# Patient Record
Sex: Female | Born: 1935 | Race: White | Hispanic: No | Smoking: Never smoker
Health system: Southern US, Community
[De-identification: ages and names within clinical notes are randomized; demographics above are authoritative.]

## PROBLEM LIST (undated history)

## (undated) DIAGNOSIS — C439 Malignant melanoma of skin, unspecified: Secondary | ICD-10-CM

## (undated) DIAGNOSIS — I10 Essential (primary) hypertension: Secondary | ICD-10-CM

---

## 1998-11-05 ENCOUNTER — Other Ambulatory Visit: Admission: RE | Admit: 1998-11-05 | Discharge: 1998-11-05 | Payer: Self-pay | Admitting: Obstetrics & Gynecology

## 1999-12-20 ENCOUNTER — Other Ambulatory Visit: Admission: RE | Admit: 1999-12-20 | Discharge: 1999-12-20 | Payer: Self-pay | Admitting: Obstetrics & Gynecology

## 2003-04-19 ENCOUNTER — Other Ambulatory Visit: Admission: RE | Admit: 2003-04-19 | Discharge: 2003-04-19 | Payer: Self-pay | Admitting: Obstetrics & Gynecology

## 2004-06-16 ENCOUNTER — Ambulatory Visit: Payer: Self-pay | Admitting: Internal Medicine

## 2005-05-09 ENCOUNTER — Other Ambulatory Visit: Admission: RE | Admit: 2005-05-09 | Discharge: 2005-05-09 | Payer: Self-pay | Admitting: Obstetrics & Gynecology

## 2006-10-24 ENCOUNTER — Ambulatory Visit: Payer: Self-pay | Admitting: Internal Medicine

## 2008-07-16 ENCOUNTER — Ambulatory Visit: Payer: Self-pay | Admitting: Urology

## 2010-03-15 ENCOUNTER — Ambulatory Visit: Payer: Self-pay | Admitting: Internal Medicine

## 2010-06-13 ENCOUNTER — Emergency Department: Payer: Self-pay | Admitting: Emergency Medicine

## 2010-11-12 ENCOUNTER — Ambulatory Visit: Payer: Self-pay | Admitting: Internal Medicine

## 2012-06-12 ENCOUNTER — Ambulatory Visit: Payer: Self-pay | Admitting: Internal Medicine

## 2012-08-29 ENCOUNTER — Ambulatory Visit: Payer: Self-pay | Admitting: Internal Medicine

## 2013-06-13 ENCOUNTER — Ambulatory Visit: Payer: Self-pay | Admitting: Family

## 2014-06-24 ENCOUNTER — Ambulatory Visit: Payer: Self-pay | Admitting: Internal Medicine

## 2017-05-28 ENCOUNTER — Encounter: Payer: Self-pay | Admitting: *Deleted

## 2017-05-28 ENCOUNTER — Emergency Department: Payer: Medicare Other

## 2017-05-28 ENCOUNTER — Emergency Department
Admission: EM | Admit: 2017-05-28 | Discharge: 2017-05-28 | Disposition: A | Discharge status: Home / Self Care | Payer: Medicare Other | Attending: Emergency Medicine | Admitting: Emergency Medicine

## 2017-05-28 DIAGNOSIS — Y9389 Activity, other specified: Secondary | ICD-10-CM | POA: Diagnosis not present

## 2017-05-28 DIAGNOSIS — Y9289 Other specified places as the place of occurrence of the external cause: Secondary | ICD-10-CM | POA: Diagnosis not present

## 2017-05-28 DIAGNOSIS — S0003XA Contusion of scalp, initial encounter: Secondary | ICD-10-CM | POA: Diagnosis not present

## 2017-05-28 DIAGNOSIS — S52501A Unspecified fracture of the lower end of right radius, initial encounter for closed fracture: Secondary | ICD-10-CM

## 2017-05-28 DIAGNOSIS — Y999 Unspecified external cause status: Secondary | ICD-10-CM | POA: Diagnosis not present

## 2017-05-28 DIAGNOSIS — W108XXA Fall (on) (from) other stairs and steps, initial encounter: Secondary | ICD-10-CM | POA: Insufficient documentation

## 2017-05-28 DIAGNOSIS — W19XXXA Unspecified fall, initial encounter: Secondary | ICD-10-CM

## 2017-05-28 DIAGNOSIS — S6991XA Unspecified injury of right wrist, hand and finger(s), initial encounter: Secondary | ICD-10-CM | POA: Diagnosis present

## 2017-05-28 NOTE — Discharge Instructions (Signed)
Follow-up with Dr. Rudene Christians, offered an appointment. Please keep your arm elevated as much as possible and apply ice at least 3 times a day. If your fingers become swollen and the ring feels tight please return to emergency department for ring removal. Take Tylenol as needed for pain. If you develop a headache please return to the emergency department

## 2017-05-28 NOTE — ED Notes (Signed)
Pt reports that she tripped on a step and caught herself with left arm/wrist yesterday - the area started swelling last night and the pain increased so pt came to ER for eval - pt c/o severe pain with wrist movement - pt is able to move fingers with cap refill less than 3 seconds

## 2017-05-28 NOTE — ED Provider Notes (Signed)
[facility] Emergency Department Provider Note  ____________________________________________   First MD Initiated Contact with Patient 05/28/17 1431     (approximate)  I have reviewed the triage vital signs and the nursing notes.   HISTORY  Chief Complaint Wrist Pain    HPI Nancy Warren is a 83 y.o. female 8 she was climbing the steps to go into a trailer yesterday and fell backwards landing on her left side. She landed on her wrist, left upper arm, and shoulder. She also hit her head and has a bruise on the left side of her scalp.did not lose consciousness. Had a small headache yesterday but none today. Does take an aspirin a day. Denies any other injuries.   History reviewed. No pertinent past medical history.  There are no active problems to display for this patient.   History reviewed. No pertinent surgical history.  Prior to Admission medications   Not on File    Allergies Patient has no allergy information on record.  History reviewed. No pertinent family history.  Social History Social History  Substance Use Topics  . Smoking status: Never Smoker  . Smokeless tobacco: Never Used  . Alcohol use Yes     Comment: occasionally    Review of Systems  Constitutional: No fever/chills, no headache today Eyes: No visual changes. ENT: No sore throat. Respiratory: Denies cough Genitourinary: Negative for dysuria. Musculoskeletal: Negative for back pain. positive for left wrist pain Skin: Negative for rash. Positive for bruising    ____________________________________________   PHYSICAL EXAM:  VITAL SIGNS: ED Triage Vitals  Enc Vitals Group     BP 05/28/17 1300 (!) 200/83     Pulse Rate 05/28/17 1300 65     Resp 05/28/17 1300 16     Temp 05/28/17 1300 98.2 F (36.8 C)     Temp src --      SpO2 05/28/17 1300 96 %     Weight 05/28/17 1302 158 lb (71.7 kg)     Height 05/28/17 1302 5\' 3"  (1.6 m)     Head Circumference --       Peak Flow --      Pain Score 05/28/17 1259 7     Pain Loc --      Pain Edu? --      Excl. in [place]? --     Constitutional: Alert and oriented. Well appearing and in no acute distress. Eyes: Conjunctivae are normal. perrl eomi Head: tender on left side scalp, parietal area Nose: No congestion/rhinnorhea. Mouth/Throat: Mucous membranes are moist.   Cardiovascular: Normal rate, regular rhythm. Elevated blood pressure Respiratory: Normal respiratory effort.  No retractions GU: deferred Musculoskeletal: eft wrist is swollen and questionable deformity at the distal radius. Patient is able to move all fingers area elbow is nontender. Shoulder is a little tender.Neurovascular is intact Neurologic:  Normal speech and language.  Skin:  Skin is warm, dry and intact. No rash noted.Positive bruising at the left wrist and left side of scalp. Psychiatric: Mood and affect are normal. Speech and behavior are normal.  ____________________________________________   LABS (all labs ordered are listed, but only abnormal results are displayed)  Labs Reviewed - No data to display ____________________________________________   ____________________________________________  RADIOLOGY  Left wrist with fracture at distal radius and avulsion to the styloid. CT of the head ordered due to patient's age and aspirin use along with headache yesterday.cT was negative for acute bleed  ____________________________________________   PROCEDURES  Procedure(s) performed: plan  applied to the left wrist. Sling applied to the left arm.      ____________________________________________   INITIAL IMPRESSION / ASSESSMENT AND PLAN / ED COURSE  Pertinent labs & imaging results that were available during my care of the patient were reviewed by me and considered in my medical decision making (see chart for details).  Patient is a very "young" 83 year old female. She appears well and in no distress. She has a  fracture of the distal radius and an avulsion fracture of the styloid process. Patient will be placed in a short arm splint. CT of the head ordered. CT was negative. Patient was discharged in the care of her daughter.  They were given instructions to call orthopedics for an appointment. review of instructions to return to the emergency department if any sign of headache. Patient is to return to the emergency department if her fingers become swollen. Instructed to keep the arm elevated and apply ice. Patient to use Tylenol as needed for pain. Patient did not want any narcotic pain medicine.      ____________________________________________   FINAL CLINICAL IMPRESSION(S) / ED DIAGNOSES  Final diagnoses:  Closed fracture of distal end of right radius, unspecified fracture morphology, initial encounter  Fall, initial encounter  Contusion of scalp, initial encounter      NEW MEDICATIONS STARTED DURING THIS VISIT:  New Prescriptions   No medications on file     Note:  This document was prepared using Dragon voice recognition software and may include unintentional dictation errors.    Versie Starks, PA-C 05/28/17 1548    Lavonia Drafts, MD 05/29/17 [phone]

## 2017-05-28 NOTE — ED Triage Notes (Signed)
Pt reports having fallen on left wrist yesterday. Swelling and bruising noted to wrist. PT able to move fingers and sensation is intact at this time.

## 2017-09-15 ENCOUNTER — Other Ambulatory Visit: Payer: Self-pay | Admitting: Internal Medicine

## 2017-09-15 DIAGNOSIS — K625 Hemorrhage of anus and rectum: Secondary | ICD-10-CM

## 2017-09-15 DIAGNOSIS — R1031 Right lower quadrant pain: Secondary | ICD-10-CM

## 2017-09-15 DIAGNOSIS — R1032 Left lower quadrant pain: Principal | ICD-10-CM

## 2017-09-25 ENCOUNTER — Ambulatory Visit
Admission: RE | Admit: 2017-09-25 | Discharge: 2017-09-25 | Disposition: A | Discharge status: Home / Self Care | Payer: Medicare Other | Source: Ambulatory Visit | Attending: Internal Medicine | Admitting: Internal Medicine

## 2017-09-25 DIAGNOSIS — K5730 Diverticulosis of large intestine without perforation or abscess without bleeding: Secondary | ICD-10-CM | POA: Insufficient documentation

## 2017-09-25 DIAGNOSIS — R1032 Left lower quadrant pain: Secondary | ICD-10-CM | POA: Insufficient documentation

## 2017-09-25 DIAGNOSIS — K8020 Calculus of gallbladder without cholecystitis without obstruction: Secondary | ICD-10-CM | POA: Diagnosis not present

## 2017-09-25 DIAGNOSIS — I700 Atherosclerosis of aorta: Secondary | ICD-10-CM | POA: Diagnosis not present

## 2017-09-25 DIAGNOSIS — R918 Other nonspecific abnormal finding of lung field: Secondary | ICD-10-CM | POA: Insufficient documentation

## 2017-09-25 DIAGNOSIS — K625 Hemorrhage of anus and rectum: Secondary | ICD-10-CM | POA: Diagnosis present

## 2017-09-25 DIAGNOSIS — R1031 Right lower quadrant pain: Secondary | ICD-10-CM | POA: Insufficient documentation

## 2017-09-25 HISTORY — DX: Malignant melanoma of skin, unspecified: C43.9

## 2017-09-25 HISTORY — DX: Essential (primary) hypertension: I10

## 2017-09-25 MED ORDER — IOPAMIDOL (ISOVUE-300) INJECTION 61%
85.0000 mL | Freq: Once | INTRAVENOUS | Status: AC | PRN
  Administered 2017-09-25: 85 mL via INTRAVENOUS

## 2019-10-06 IMAGING — DX DG WRIST COMPLETE 3+V*L*
4 series · 4 of 4 positions shown · non-contrast
Comparison: None.

CLINICAL DATA: Fall yesterday with left wrist injury, pain and
swelling. Initial encounter.

EXAM:
LEFT WRIST - COMPLETE 3+ VIEW

[wrist ap (1 of 2)]
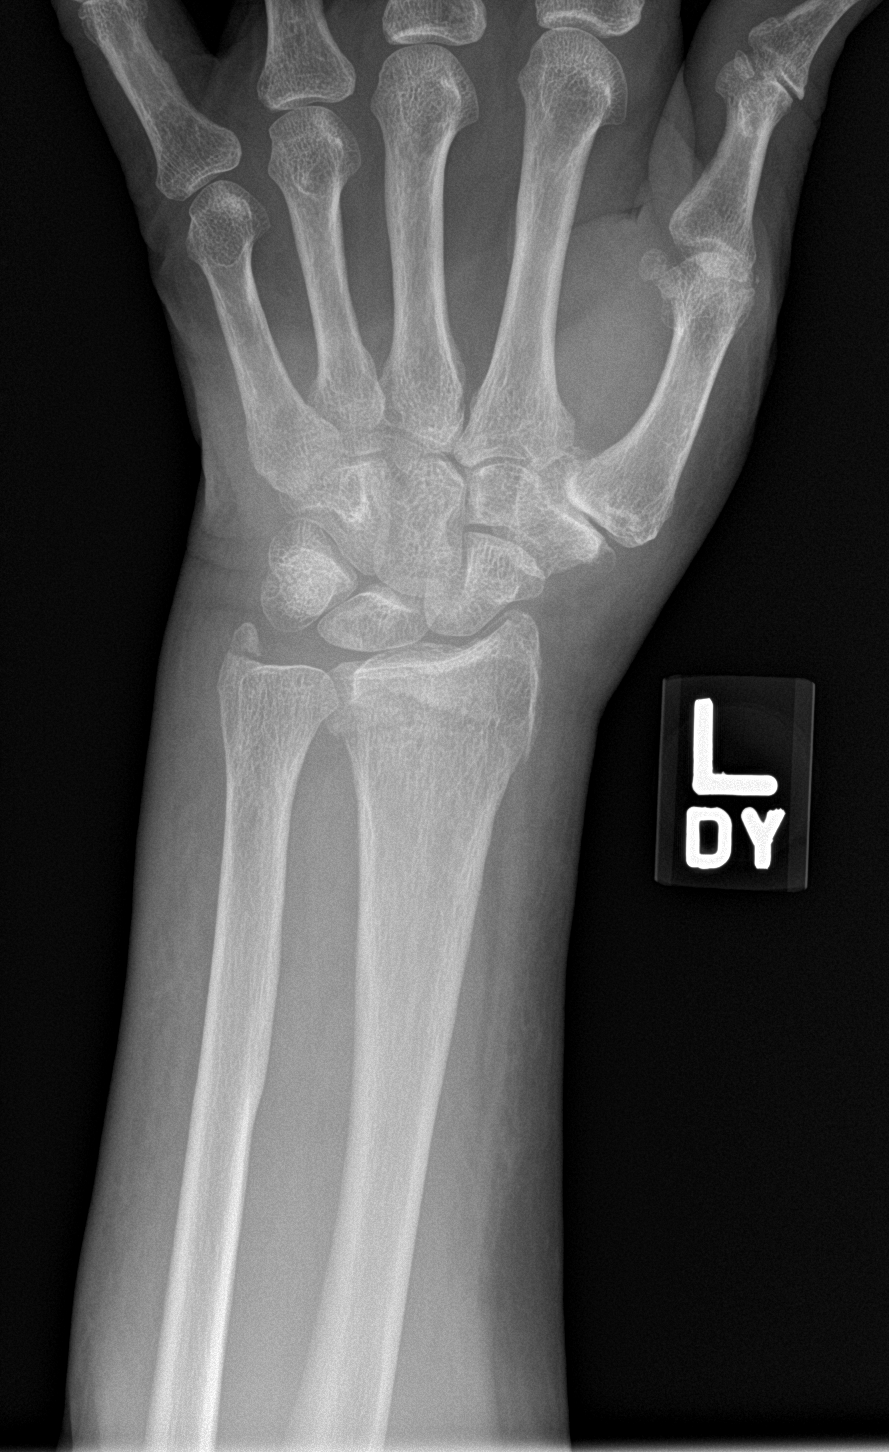

[wrist obl]
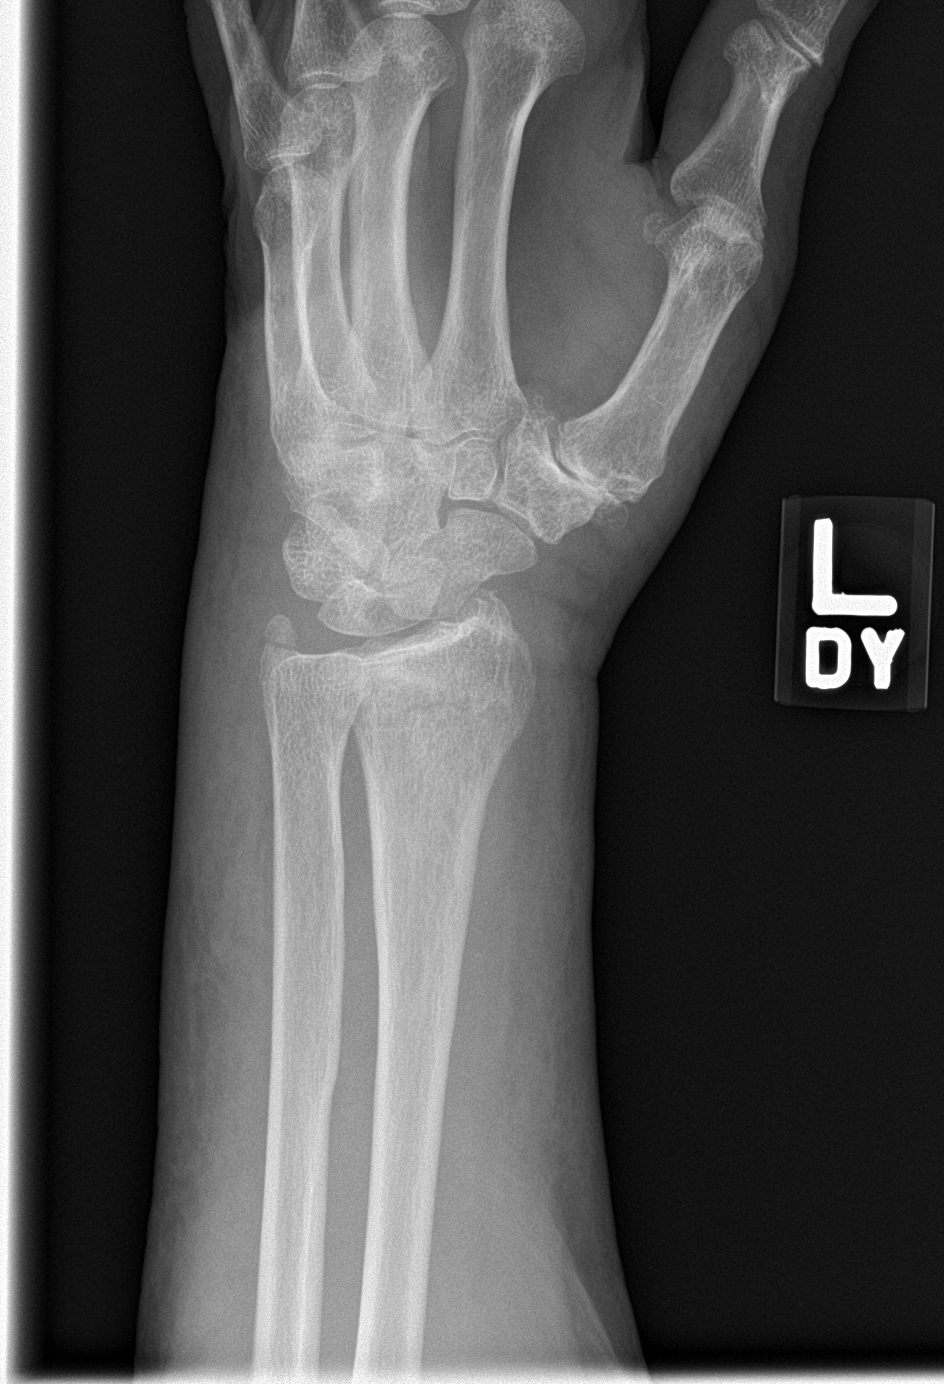

[wrist lat]
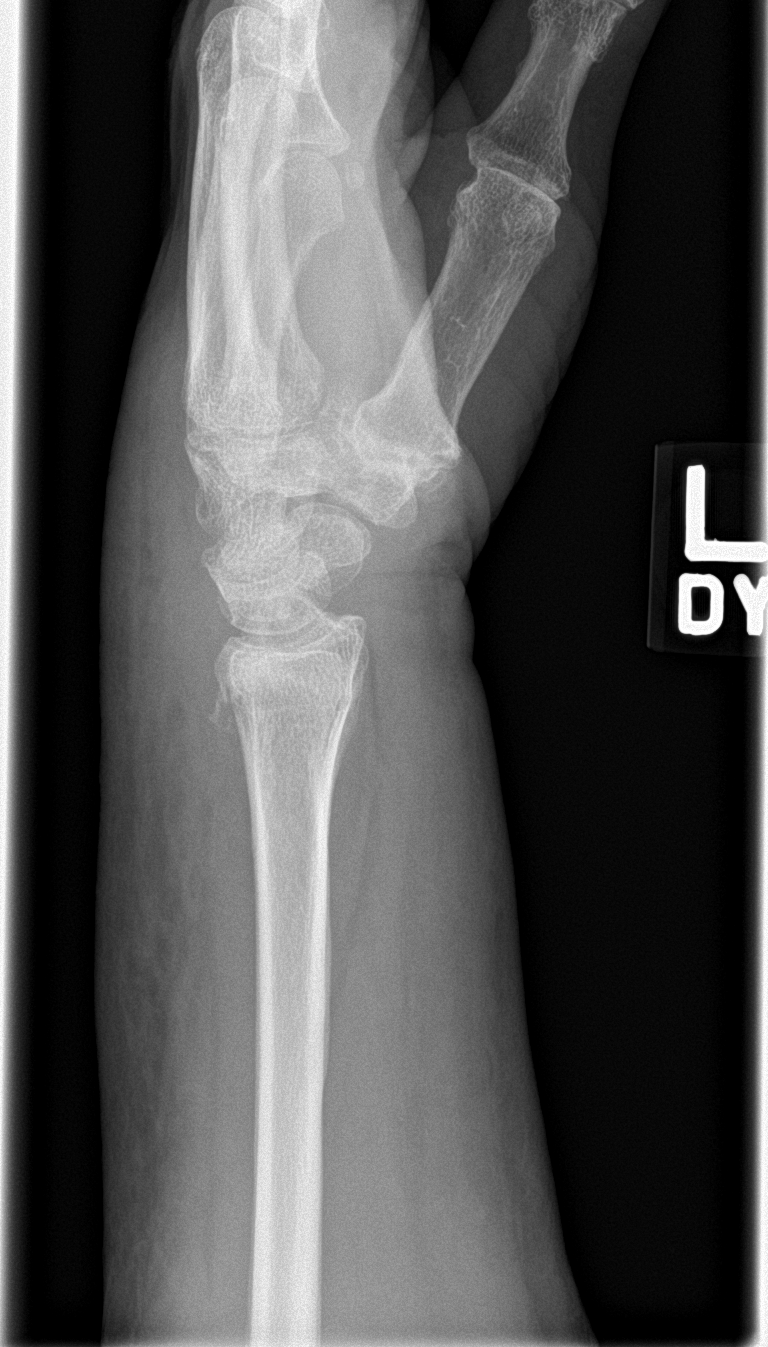

[wrist ap (2 of 2)]
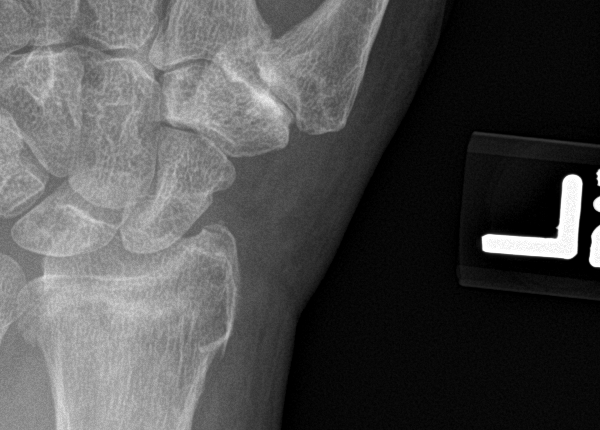

[4 of 4 positions shown; findings below may reference images not displayed]

FINDINGS: Acute impacted fracture transversely oriented through the distal
radial metaphysis shows no significant angulation. There are mildly
displaced cortical fragments dorsally. Acute ulnar styloid avulsion
also noted with minimal displacement. The carpal bones show normal
alignment without evidence of fracture or dislocation. Soft tissue
swelling present.
IMPRESSION: Acute an impacted fracture of the distal radial metaphysis showing
no significant angulation. Some displaced cortical fragments are
present dorsally. Associated ulnar styloid avulsion shows minimal
displacement.

## 2020-03-10 NOTE — Progress Notes (Signed)
03/11/2020 9:24 AM   Nancy Warren Sep 10, 1925 [id number]  Referring provider: Idelle Crouch, MD [facility] [place],  [place] [address] Chief Complaint  Patient presents with  . Recurrent UTI    HPI: Nancy Warren is a 86 y.o. female with UTI symptoms presents today for evaluation.   She is accompanied today by her daughter.  She reports that she has been having recurrent urinary tract infections since May.  She is had multiple rounds of antibiotics as outlined below and shortly after completing the antibiotics, her symptoms quickly returned.  She has had several treatment rounds with no UA/urinalysis.  CT A/P w/ contrast on 09/25/2017 showed no pathology.  She was seen at [facility] on 01/30/2020 for possible infection. UA was negative and she was still treated with antibiotics.   The patient was seen by Dr. Nicki Reaper on 03/06/2020 at [facility]. She had urgency to urinate but could not go. She had constant burning pain around the pelvic area (7/10). She had dysuria and hematuria off and on x 2 months. She reported being treated with Cipro, which proved resistant. Augmentin gave her diarrhea. She was currently on Macrobid. No flank pain, fever, nausea or vomiting.   Patient was thought to have a chronic or recurrent infection and was treated with Omnicef 300 mg BID x 10 days which she remains on. UA on 03/06/2020 showed protein 100, glucose 250, gross hematuria, nitrite positive, large leukocytes, WBC >50, RBC >50, moderate bacteria and rare squamous epithelial cells. Associated culture grew mixed flora less than 10,000 colonies/mL.  (+) Urine culture: 02/10/2020: E .Coli 07/02/2018: E. Coli  PVR is 7 mL today.   Patient is currently on antibiotic treatment for recently diagnosed UTI. She denies any symptoms today.  Reports constant vaginal irritation and burning externally. She does not wipe her vagina but instead pats the area dry. Denies  history of cancer. She is not using any estrogen cream.   Reports occasional constipation.    PMH: Past Medical History:  Diagnosis Date  . Hypertension   . Melanoma of skin ([place])    Pt states was resected from her back 40 years ago.     Surgical History: No past surgical history on file.  Home Medications:  Allergies as of 03/11/2020   No Known Allergies     Medication List       Accurate as of March 11, 2020 11:59 PM. If you have any questions, ask your nurse or doctor.        STOP taking these medications   Cranberry 1000 MG Caps Stopped by: Hollice Espy, MD   ELDERBERRY PO Stopped by: Hollice Espy, MD   lansoprazole 15 MG capsule Commonly known as: PREVACID Stopped by: Hollice Espy, MD     TAKE these medications   ASPIRIN 81 PO Aspir-81   cefdinir 300 MG capsule Commonly known as: OMNICEF Take 300 mg by mouth 2 (two) times daily.   cyanocobalamin 1000 MCG tablet Take by mouth.   estradiol 0.1 MG/GM vaginal cream Commonly known as: ESTRACE Apply pea size amount to urethral area mon, wed, Friday. Started by: Hollice Espy, MD   lisinopril 10 MG tablet Commonly known as: ZESTRIL Take 10 mg by mouth 2 (two) times daily.   metoprolol succinate 100 MG 24 hr tablet Commonly known as: TOPROL-XL Take by mouth.   omeprazole 40 MG capsule Commonly known as: PRILOSEC Take 40 mg by mouth daily.   simvastatin 20  MG tablet Commonly known as: ZOCOR Take 20 mg by mouth daily.       Allergies: No Known Allergies  Family History: No family history on file.  Social History:  reports that she has never smoked. She has never used smokeless tobacco. She reports current alcohol use. She reports that she does not use drugs.   Physical Exam: BP (!) 186/85   Pulse 60   Ht 5\' 2"  (1.575 m)   Wt 152 lb (68.9 kg)   BMI 27.80 kg/m   Constitutional:  Alert and oriented, No acute distress. Accompanied by her daughter Manuela Schwartz.  Appears spry in  significantly younger than stated age. HEENT: [place] AT, moist mucus membranes.  Trachea midline, no masses. Cardiovascular: No clubbing, cyanosis, or edema. Respiratory: Normal respiratory effort, no increased work of breathing. Skin: No rashes, bruises or suspicious lesions. Neurologic: Grossly intact, no focal deficits, moving all 4 extremities. Psychiatric: Normal mood and affect.  Urinalysis Negative  Assessment & Plan:    1. Recurrent UTI UA is negative.  PVR is 7 mL. Discussed incompletely treated symptoms vs rUTI , lack of culture data to differentiate Patient has only 1 positive culture indicating a true infection. Upper tract imaging from 2 years ago was normal which is reassuring We discussed the pathophysiology of recurrent urinary tract infections in postmenopausal woman at length today Advise using topical estrogen cream (pea size amount) nightly 3 times/week, cranberry tablets and adding probiotics to her diet. I like her to return if she has any signs or symptoms of infection so we can continue to document her infections and consider further work-up including cystoscopy, further imaging, suppressive antibiotics, etc. if she does not fact have continued frequent culture proven infection  Follow up as needed.  [place] [address], [place],  [address] [phone]  I, Selena Batten, am acting as a scribe for Dr. Hollice Espy.  I have reviewed the above documentation for accuracy and completeness, and I agree with the above.   Hollice Espy, MD  I spent 45 total minutes on the day of the encounter including pre-visit review of the medical record, face-to-face time with the patient, and post visit ordering of labs/imaging/tests.

## 2020-03-11 ENCOUNTER — Other Ambulatory Visit: Payer: Self-pay

## 2020-03-11 ENCOUNTER — Ambulatory Visit (INDEPENDENT_AMBULATORY_CARE_PROVIDER_SITE_OTHER): Payer: Medicare Other | Admitting: Urology

## 2020-03-11 VITALS — BP 186/85 | HR 60 | Ht 62.0 in | Wt 152.0 lb

## 2020-03-11 DIAGNOSIS — N390 Urinary tract infection, site not specified: Secondary | ICD-10-CM | POA: Diagnosis not present

## 2020-03-11 DIAGNOSIS — R3129 Other microscopic hematuria: Secondary | ICD-10-CM

## 2020-03-11 LAB — BLADDER SCAN AMB NON-IMAGING: Scan Result: 7

## 2020-03-11 MED ORDER — ESTRADIOL 0.1 MG/GM VA CREA: TOPICAL_CREAM | VAGINAL | 12 refills | Status: DC

## 2020-03-12 LAB — URINALYSIS, COMPLETE
Bilirubin, UA: NEGATIVE
Glucose, UA: NEGATIVE
Ketones, UA: NEGATIVE
Nitrite, UA: NEGATIVE
Protein,UA: NEGATIVE
Specific Gravity, UA: <1.005 — ABNORMAL LOW (ref 1.005–1.030)
Urobilinogen, Ur: 0.2 mg/dL (ref 0.2–1.0)
pH, UA: 5.5 (ref 5.0–7.5)

## 2020-03-12 LAB — MICROSCOPIC EXAMINATION: Bacteria, UA: NONE SEEN

## 2021-08-24 DIAGNOSIS — Z85828 Personal history of other malignant neoplasm of skin: Secondary | ICD-10-CM | POA: Diagnosis not present

## 2021-08-24 DIAGNOSIS — X32XXXA Exposure to sunlight, initial encounter: Secondary | ICD-10-CM | POA: Diagnosis not present

## 2021-08-24 DIAGNOSIS — Z85820 Personal history of malignant melanoma of skin: Secondary | ICD-10-CM | POA: Diagnosis not present

## 2021-08-24 DIAGNOSIS — L570 Actinic keratosis: Secondary | ICD-10-CM | POA: Diagnosis not present

## 2021-08-24 DIAGNOSIS — D2261 Melanocytic nevi of right upper limb, including shoulder: Secondary | ICD-10-CM | POA: Diagnosis not present

## 2021-08-24 DIAGNOSIS — D2262 Melanocytic nevi of left upper limb, including shoulder: Secondary | ICD-10-CM | POA: Diagnosis not present

## 2021-08-24 DIAGNOSIS — D2271 Melanocytic nevi of right lower limb, including hip: Secondary | ICD-10-CM | POA: Diagnosis not present

## 2021-09-21 DIAGNOSIS — L570 Actinic keratosis: Secondary | ICD-10-CM | POA: Diagnosis not present

## 2021-09-21 DIAGNOSIS — C44729 Squamous cell carcinoma of skin of left lower limb, including hip: Secondary | ICD-10-CM | POA: Diagnosis not present

## 2021-09-21 DIAGNOSIS — D0472 Carcinoma in situ of skin of left lower limb, including hip: Secondary | ICD-10-CM | POA: Diagnosis not present

## 2021-10-07 DIAGNOSIS — D0472 Carcinoma in situ of skin of left lower limb, including hip: Secondary | ICD-10-CM | POA: Diagnosis not present

## 2021-10-26 DIAGNOSIS — J069 Acute upper respiratory infection, unspecified: Secondary | ICD-10-CM | POA: Diagnosis not present

## 2021-10-26 DIAGNOSIS — Z20822 Contact with and (suspected) exposure to covid-19: Secondary | ICD-10-CM | POA: Diagnosis not present

## 2021-12-25 DIAGNOSIS — N3001 Acute cystitis with hematuria: Secondary | ICD-10-CM | POA: Diagnosis not present

## 2022-01-05 DIAGNOSIS — R8290 Unspecified abnormal findings in urine: Secondary | ICD-10-CM | POA: Diagnosis not present

## 2022-01-05 DIAGNOSIS — Z79899 Other long term (current) drug therapy: Secondary | ICD-10-CM | POA: Diagnosis not present

## 2022-01-05 DIAGNOSIS — Z0000 Encounter for general adult medical examination without abnormal findings: Secondary | ICD-10-CM | POA: Diagnosis not present

## 2022-01-05 DIAGNOSIS — I129 Hypertensive chronic kidney disease with stage 1 through stage 4 chronic kidney disease, or unspecified chronic kidney disease: Secondary | ICD-10-CM | POA: Diagnosis not present

## 2022-01-05 DIAGNOSIS — N189 Chronic kidney disease, unspecified: Secondary | ICD-10-CM | POA: Diagnosis not present

## 2022-01-05 DIAGNOSIS — E785 Hyperlipidemia, unspecified: Secondary | ICD-10-CM | POA: Diagnosis not present

## 2022-01-19 DIAGNOSIS — R300 Dysuria: Secondary | ICD-10-CM | POA: Diagnosis not present

## 2022-01-19 DIAGNOSIS — N390 Urinary tract infection, site not specified: Secondary | ICD-10-CM | POA: Diagnosis not present

## 2022-02-09 DIAGNOSIS — R319 Hematuria, unspecified: Secondary | ICD-10-CM | POA: Diagnosis not present

## 2022-02-09 DIAGNOSIS — N390 Urinary tract infection, site not specified: Secondary | ICD-10-CM | POA: Diagnosis not present

## 2022-02-09 DIAGNOSIS — N1831 Chronic kidney disease, stage 3a: Secondary | ICD-10-CM | POA: Diagnosis not present

## 2022-02-15 NOTE — Progress Notes (Incomplete)
02/16/22 12:36 PM   Nancy Warren [birth year] [id number]  Referring provider:  Idelle Crouch, MD [place] [facility] [place],   [address] Chief Complaint  Patient presents with   Recurrent UTI    HPI: Nancy Warren is a 88 y.o.female with a personal history rUTIs of who presents today for further evaluation of UTI.   She was last seen in clinic on 03/11/2020 she was noted at the time to have UTI symptoms.  She has been using intermittent periurethral estrogen since that time, 3 days a week.  She not had up issue with recurrent infections until recently.  She is traveling to [place] for Mother[place] Day where she had her first infection.  Unfortunately, these results are not immediately available to me.  She reports that she was called and ask whether or not she wanted to continue her prescribed antibiotic or switch to a different based on the culture data, given that she was improving she remained on the first.  She was seen at [facility] on 01/19/2022 and was treated with antibiotics for UTI. Urine culture showed no growth. She was seen again on 02/09/2022, urinalysis showed trace ketones, large blood, nitrite positive, moderate bacteria, >50 WBCs, 10-50 RBCs, and moderate bacteria. Urine culture grew mixed urogenital flora; she ws treated with OMNICEF.   Her symptoms cleared but about a week later, she started having recurrent symptoms including lower abdominal pressure, dysuria, urgency and frequency.  She also reports chills but no objective fevers.  Her urinalysis was frankly positive on 02/09/2022 and she grew mixed flora on this occasion.  This report that during 1 of ear infections, she saw some gross blood when she was acutely symptomatic.  She has not had any blood outside of this occasion.  No recent upper tract imaging.  She has been taking an Azo product but she is not sure which, believes is for UTI prevention.  Only started taking this  about 4 weeks ago.  He is currently on antibiotics with a few more days left, asymptomatic.   PMH: Past Medical History:  Diagnosis Date   Hypertension    Melanoma of skin ([place])    Pt states was resected from her back 40 years ago.     Surgical History: No past surgical history on file.  Home Medications:  Allergies as of 02/16/2022   No Known Allergies      Medication List        Accurate as of February 16, 2022 12:36 PM. If you have any questions, ask your nurse or doctor.          ASPIRIN 81 PO Aspir-81   cefdinir 300 MG capsule Commonly known as: OMNICEF Take 300 mg by mouth 2 (two) times daily.   cyanocobalamin 1000 MCG tablet Take by mouth.   estradiol 0.1 MG/GM vaginal cream Commonly known as: ESTRACE Apply pea size amount to urethral area mon, wed, Friday.   lisinopril 10 MG tablet Commonly known as: ZESTRIL Take 10 mg by mouth 2 (two) times daily.   metoprolol succinate 100 MG 24 hr tablet Commonly known as: TOPROL-XL Take by mouth.   omeprazole 40 MG capsule Commonly known as: PRILOSEC Take 40 mg by mouth daily.   simvastatin 20 MG tablet Commonly known as: ZOCOR Take 20 mg by mouth daily.        Allergies:  No Known Allergies  Family History: No family history on file.  Social History:  reports that  she has never smoked. She has never used smokeless tobacco. She reports current alcohol use. She reports that she does not use drugs.   Physical Exam: BP (!) 171/86   Pulse 69   Ht '5\' 2"'$  (1.575 m)   Wt 152 lb (68.9 kg)   BMI 27.80 kg/m   Constitutional:  Alert and oriented, No acute distress.  Accompanied by her daughter today. HEENT: [place] AT, moist mucus membranes.  Trachea midline, no masses. Cardiovascular: No clubbing, cyanosis, or edema. Respiratory: Normal respiratory effort, no increased work of breathing. Skin: No rashes, bruises or suspicious lesions. Neurologic: Grossly intact, no focal deficits, moving all 4  extremities. Psychiatric: Normal mood and affect.  Laboratory Data: Ua today with   Urinalysis Consistent with resolving UTI, few residual WBCs and bacteria but no RBCs, nitrate negative.  Results for orders placed or performed in visit on 02/16/22  Microscopic Examination   Urine  Result Value Ref Range   WBC, UA >30 (A) 0 - 5 /hpf   RBC, Urine 0-2 0 - 2 /hpf   Epithelial Cells (non renal) 0-10 0 - 10 /hpf   Renal Epithel, UA 0-10 (A) None seen /hpf   Bacteria, UA Many (A) None seen/Few  Urinalysis, Complete  Result Value Ref Range   Specific Gravity, UA 1.010 1.005 - 1.030   pH, UA 5.0 5.0 - 7.5   Color, UA Yellow Yellow   Appearance Ur Clear Clear   Leukocytes,UA 2+ (A) Negative   Protein,UA Negative Negative/Trace   Glucose, UA Negative Negative   Ketones, UA Negative Negative   RBC, UA Negative Negative   Bilirubin, UA Negative Negative   Urobilinogen, Ur 0.2 0.2 - 1.0 mg/dL   Nitrite, UA Negative Negative   Microscopic Examination See below:      Pertinent Imaging: N/a   Assessment & Plan:    1. Recurrent UTI Recent symptomatic UTI x3, unclear whether this was reinfection or incompletely treated/unresolved infection  UA consistent with resolving infection, complete course  We discussed consideration of upper tract imaging, none since 2019 to rule out upper tract nidus.  She is agreeable this plan.  We will plan for renal ultrasound and call her with results.  The patient is above, we discussed that if she continues to get symptomatic infection, may consider suppression of antibiotics for 90 days but would like to avoid this.  If she has any signs or symptoms of recurrent infection, would like her to come to our office for evaluation for treatment and suppression.  She is agreeable this plan.  In addition to the above, recommend continuation of topical estrogen cream 3 times per week.  We also discussed the addition of daily cranberry tablets, d-mannose, and  probiotic.  She was given handout about this.  - Urinalysis, Complete - Ultrasound renal complete; Future  2. Gross hematuria Isolated event associated with acute symptomatic infection  Multiple previous urinalysis reviewed outside of these infections and no evidence of microscopic blood.  She denies any other episodes of gross hematuria in the absence of this.  As such, would defer work-up for this for the time being.  She was advised if she ever does have gross blood in the absence of infection, would recommend cystoscopy.  Follow-up as needed, will call with renal ultrasound  [place] [address] [place], [place] [address] [phone]  I spent 32 total minutes on the day of the encounter including pre-visit review of the medical record, face-to-face  time with the patient, and post visit ordering of labs/imaging/tests.

## 2022-02-16 ENCOUNTER — Encounter: Payer: Self-pay | Admitting: Urology

## 2022-02-16 ENCOUNTER — Ambulatory Visit: Payer: Medicare Other | Admitting: Urology

## 2022-02-16 ENCOUNTER — Ambulatory Visit: Payer: Medicare HMO | Admitting: Urology

## 2022-02-16 VITALS — BP 171/86 | HR 69 | Ht 62.0 in | Wt 152.0 lb

## 2022-02-16 DIAGNOSIS — N390 Urinary tract infection, site not specified: Secondary | ICD-10-CM

## 2022-02-16 DIAGNOSIS — R310 Gross hematuria: Secondary | ICD-10-CM

## 2022-02-16 LAB — MICROSCOPIC EXAMINATION: WBC, UA: >30 /hpf — AB (ref 0–5)

## 2022-02-16 LAB — URINALYSIS, COMPLETE
Bilirubin, UA: NEGATIVE
Glucose, UA: NEGATIVE
Ketones, UA: NEGATIVE
Nitrite, UA: NEGATIVE
Protein,UA: NEGATIVE
RBC, UA: NEGATIVE
Specific Gravity, UA: 1.01 (ref 1.005–1.030)
Urobilinogen, Ur: 0.2 mg/dL (ref 0.2–1.0)
pH, UA: 5 (ref 5.0–7.5)

## 2022-02-16 MED ORDER — ESTRADIOL 0.1 MG/GM VA CREA: TOPICAL_CREAM | VAGINAL | 12 refills | Status: DC

## 2022-02-27 DIAGNOSIS — N3091 Cystitis, unspecified with hematuria: Secondary | ICD-10-CM | POA: Diagnosis not present

## 2022-02-27 DIAGNOSIS — E785 Hyperlipidemia, unspecified: Secondary | ICD-10-CM | POA: Diagnosis not present

## 2022-02-27 DIAGNOSIS — N3001 Acute cystitis with hematuria: Secondary | ICD-10-CM | POA: Diagnosis not present

## 2022-02-27 DIAGNOSIS — B9689 Other specified bacterial agents as the cause of diseases classified elsewhere: Secondary | ICD-10-CM | POA: Diagnosis not present

## 2022-02-27 DIAGNOSIS — Z87440 Personal history of urinary (tract) infections: Secondary | ICD-10-CM | POA: Diagnosis not present

## 2022-02-27 DIAGNOSIS — I10 Essential (primary) hypertension: Secondary | ICD-10-CM | POA: Diagnosis not present

## 2022-02-28 ENCOUNTER — Telehealth: Payer: Self-pay | Admitting: *Deleted

## 2022-02-28 NOTE — Telephone Encounter (Signed)
Daughter calling asking for Dr. Erlene Quan and to let her know they are out of town and that her mom has another infection. They took pt to Urgent Care and was calling asking what should they do because the infections keep coming back. Advised pt of the 02/16/2022 office visit. They have a 03/07/2022 date for Ultrasound and will wait for results\.

## 2022-03-03 ENCOUNTER — Telehealth: Payer: Self-pay | Admitting: Urology

## 2022-03-03 NOTE — Telephone Encounter (Signed)
Nancy Warren called wanting you to know that [place] and had to go to a Urgent Care, they put her on Cefeinir 300 mg twice a day. She stated the uti came on fast and was alarming. She is going for her u/s on Monday 07/24.

## 2022-03-07 ENCOUNTER — Ambulatory Visit
Admission: RE | Admit: 2022-03-07 | Discharge: 2022-03-07 | Disposition: A | Discharge status: Home / Self Care | Payer: Medicare HMO | Source: Ambulatory Visit | Attending: Urology | Admitting: Urology

## 2022-03-07 DIAGNOSIS — R310 Gross hematuria: Secondary | ICD-10-CM | POA: Diagnosis not present

## 2022-03-07 DIAGNOSIS — N390 Urinary tract infection, site not specified: Secondary | ICD-10-CM | POA: Insufficient documentation

## 2022-03-07 DIAGNOSIS — R319 Hematuria, unspecified: Secondary | ICD-10-CM | POA: Diagnosis not present

## 2022-03-08 ENCOUNTER — Encounter: Payer: Self-pay | Admitting: Urology

## 2022-03-08 NOTE — Telephone Encounter (Addendum)
Left Vm to return call   ----- Message from Hollice Espy, MD sent at 03/08/2022  8:01 AM EDT ----- Renal ultrasound is normal, great news  Hollice Espy, MD

## 2022-03-11 NOTE — Telephone Encounter (Signed)
Patient scheduled for appt, voiced understanding.

## 2022-03-15 ENCOUNTER — Encounter: Payer: Self-pay | Admitting: Urology

## 2022-03-15 ENCOUNTER — Ambulatory Visit: Payer: Medicare HMO | Admitting: Urology

## 2022-03-15 VITALS — BP 122/80 | HR 72 | Ht 62.0 in | Wt 150.0 lb

## 2022-03-15 DIAGNOSIS — N390 Urinary tract infection, site not specified: Secondary | ICD-10-CM

## 2022-03-15 LAB — URINALYSIS, COMPLETE
Bilirubin, UA: NEGATIVE
Ketones, UA: NEGATIVE
Nitrite, UA: POSITIVE — AB
Specific Gravity, UA: <1.005 — ABNORMAL LOW (ref 1.005–1.030)
Urobilinogen, Ur: 2 mg/dL — ABNORMAL HIGH (ref 0.2–1.0)
pH, UA: 5 (ref 5.0–7.5)

## 2022-03-15 LAB — MICROSCOPIC EXAMINATION: WBC, UA: >30 /hpf — AB (ref 0–5)

## 2022-03-15 MED ORDER — NITROFURANTOIN MONOHYD MACRO 100 MG PO CAPS: 100.0000 mg | ORAL_CAPSULE | Freq: Every day | ORAL | 0 refills | Status: DC

## 2022-03-15 NOTE — Progress Notes (Signed)
03/15/22 4:10 PM   JAVIER MAMONE Nov 18, 1925 [id number]  Referring provider:  Idelle Crouch, MD [facility] [place],  [place] [address] Chief Complaint  Patient presents with   Urinary Tract Infection    HPI: Nancy Warren is a 88 y.o.female  with a  personal history of recurrent UTIs.  She was seen at [facility] on 01/19/2022 and was treated with antibiotics for a UTI.The culture showed no growth.She was seen again on 02/09/2022.UA showed trace ketones, large blood nitrite positive, moderate bacteria >50 WBC's, 10- 50 RBCS and moderate bacteria.Urine Culture grew mixed urogenital flora and she was treated with Omnicef.   She has had isolated events of gross hematuria; multiple previous urinalysis viewed outside of these infections showed no evidence of microscopic blood.   She was seen in the ER on 02/27/2022. Urine culture grew E.coli she was treated with Ceftin.   She underwent an RUS on 03/07/2022 for further evaluation of gross hematuria that visualized. Unremarkable kidneys and the bladder was not distended.   She is accompanied by her granddaughter today She reports that she just finished Ceftin.She is still having UTI symptoms.    PMH: Past Medical History:  Diagnosis Date   Hypertension    Melanoma of skin ([place])    Pt states was resected from her back 40 years ago.     Surgical History: No past surgical history on file.  Home Medications:  Allergies as of 03/15/2022   No Known Allergies      Medication List        Accurate as of March 15, 2022  4:10 PM. If you have any questions, ask your nurse or doctor.          ASPIRIN 81 PO Aspir-81   cefdinir 300 MG capsule Commonly known as: OMNICEF Take 300 mg by mouth 2 (two) times daily.   cyanocobalamin 1000 MCG tablet Take by mouth.   estradiol 0.1 MG/GM vaginal cream Commonly known as: ESTRACE Apply pea size amount to urethral area mon, wed, Friday.    lisinopril 10 MG tablet Commonly known as: ZESTRIL Take 10 mg by mouth 2 (two) times daily.   metoprolol succinate 100 MG 24 hr tablet Commonly known as: TOPROL-XL Take by mouth.   nitrofurantoin (macrocrystal-monohydrate) 100 MG capsule Commonly known as: Macrobid Take 1 capsule (100 mg total) by mouth daily. Started by: Hollice Espy, MD   omeprazole 40 MG capsule Commonly known as: PRILOSEC Take 40 mg by mouth daily.   simvastatin 20 MG tablet Commonly known as: ZOCOR Take 20 mg by mouth daily.        Allergies:  No Known Allergies  Family History: No family history on file.  Social History:  reports that she has never smoked. She has never used smokeless tobacco. She reports current alcohol use. She reports that she does not use drugs.   Physical Exam: BP 122/80   Pulse 72   Ht '5\' 2"'$  (1.575 m)   Wt 150 lb (68 kg)   BMI 27.44 kg/m   Constitutional:  Alert and oriented, No acute distress. HEENT: [place] AT, moist mucus membranes.  Trachea midline, no masses. Cardiovascular: No clubbing, cyanosis, or edema. Respiratory: Normal respiratory effort, no increased work of breathing. Skin: No rashes, bruises or suspicious lesions. Neurologic: Grossly intact, no focal deficits, moving all 4 extremities. Psychiatric: Normal mood and affect.  Laboratory Data: No results found for: "CREATININE" No results found for: "HGBA1C"  Pertinent Imaging: CLINICAL DATA:  Gross hematuria.  Recurrent UTI.   EXAM: RENAL / URINARY TRACT ULTRASOUND COMPLETE   COMPARISON:  None Available.   FINDINGS: Right Kidney:   Renal measurements: 9.7 x 4.1 x 4.5 cm = volume: 95 mL. Echogenicity within normal limits. No mass or hydronephrosis visualized.   Left Kidney:   Renal measurements: 10.7 x 4.2 x 4.7 cm = volume: 110 mL. Echogenicity within normal limits. No mass or hydronephrosis visualized.   Bladder:   Poorly distended limiting evaluation.   Other:   None.    IMPRESSION: 1. The kidneys are unremarkable. 2. The bladder is not distended limiting evaluation. No obvious abnormality.     Electronically Signed   By: Dorise Bullion III M.D.   On: 03/07/2022 17:37    I have personally reviewed the images and agree with radiologist interpretation.   Assessment & Plan:   rUTIs  - Reviewed most recent culture  - Symptomatic and frankly positive UA  - Given symptoms and number of infection will treat her with suppressive antibiotics along with concern for antibiotic stewardess will treat with Macrobid in 6 months and will plan for cytoscopy if symptoms do not improve  - RUS reassuring   No follow-ups on file.  I,Kailey Littlejohn,acting as a Education administrator for Hollice Espy, MD.,have documented all relevant documentation on the behalf of Hollice Espy, MD,as directed by  Hollice Espy, MD while in the presence of Hollice Espy, [place] [address], [place], [place] [address] [phone]

## 2022-03-15 NOTE — Patient Instructions (Signed)
If still having UTI symptoms call for a cath specimen

## 2022-03-18 ENCOUNTER — Encounter: Payer: Self-pay | Admitting: Urology

## 2022-03-18 LAB — CULTURE, URINE COMPREHENSIVE

## 2022-03-21 ENCOUNTER — Encounter: Payer: Self-pay | Admitting: Physician Assistant

## 2022-03-21 ENCOUNTER — Ambulatory Visit: Payer: Medicare HMO | Admitting: Physician Assistant

## 2022-03-21 VITALS — BP 169/74 | HR 58 | Ht 62.0 in | Wt 148.0 lb

## 2022-03-21 DIAGNOSIS — Z87440 Personal history of urinary (tract) infections: Secondary | ICD-10-CM

## 2022-03-21 DIAGNOSIS — R300 Dysuria: Secondary | ICD-10-CM

## 2022-03-21 DIAGNOSIS — N390 Urinary tract infection, site not specified: Secondary | ICD-10-CM | POA: Diagnosis not present

## 2022-03-21 DIAGNOSIS — R1030 Lower abdominal pain, unspecified: Secondary | ICD-10-CM | POA: Diagnosis not present

## 2022-03-21 NOTE — Patient Instructions (Addendum)
Continue estrogen cream 3 times weekly as well as daily cranberry, d-mannose, and probiotic supplements.  Please go ahead and start daily suppressive Macrobid to help with recurrent UTIs/chronic cystitis. I'm sending your urine out for two different types of culture today and will call you with your results. We will interrupt your daily Macrobid therapy for a treatment course of antibiotics if needed.  Please keep plans for cystoscopy with Dr. Erlene Quan.

## 2022-03-21 NOTE — Progress Notes (Unsigned)
03/21/2022 2:34 PM   Nancy Warren [birth year] [id number]  CC: Chief Complaint  Patient presents with   Dysuria    HPI: Nancy Warren is a 88 y.o. female with PMH recurrent UTIs on vaginal estrogen cream, cranberry supplements, d-mannose supplements, and daily probiotics and possibly associated gross hematuria who presents today for evaluation of possible recurrent versus persistent UTI.   She was seen in clinic by Dr. Erlene Quan 6 days ago for evaluation of possible recurrent versus persistent UTI.  UA appeared grossly infected and was sent for culture, however it finalized with mixed urogenital flora.  She was recommended to start daily suppressive Macrobid upon completion of culture appropriate antibiotics.  Notably, she has grown mixed urogenital flora on 3 other urine cultures between May and June of this year.  Today she reports she is not taking Macrobid, as she was never treated with antibiotics after her most recent clinic visit.  Initially her symptoms improved, however they returned over the past several days.  She is primarily bothered by dysuria and lower abdominal pain.  She denies fever, chills, nausea, or vomiting.  She has been using vaginal estrogen cream for approximately the last 2 years.  She uses it 3 times weekly as instructed.  She has been taking Azo intermittently, most recently 2 days ago.  In-office catheterized UA today positive for trace intact blood, 1+ protein, nitrites, and 2+ leukocyte esterase; urine microscopy with >30 WBCs/HPF, 3-10 RBCs/HPF, and many bacteria.  Measured residual 25 mL.  PMH: Past Medical History:  Diagnosis Date   Hypertension    Melanoma of skin ([place])    Pt states was resected from her back 40 years ago.     Surgical History: No past surgical history on file.  Home Medications:  Allergies as of 03/21/2022   No Known Allergies      Medication List        Accurate as of March 21, 2022  2:34 PM. If you have any  questions, ask your nurse or doctor.          STOP taking these medications    cefdinir 300 MG capsule Commonly known as: OMNICEF Stopped by: Debroah Loop, PA-C       TAKE these medications    ASPIRIN 81 PO Aspir-81   cyanocobalamin 1000 MCG tablet Take by mouth.   estradiol 0.1 MG/GM vaginal cream Commonly known as: ESTRACE Apply pea size amount to urethral area mon, wed, Friday.   lisinopril 10 MG tablet Commonly known as: ZESTRIL Take 10 mg by mouth 2 (two) times daily.   metoprolol succinate 100 MG 24 hr tablet Commonly known as: TOPROL-XL Take by mouth.   nitrofurantoin (macrocrystal-monohydrate) 100 MG capsule Commonly known as: Macrobid Take 1 capsule (100 mg total) by mouth daily.   omeprazole 40 MG capsule Commonly known as: PRILOSEC Take 40 mg by mouth daily.   simvastatin 20 MG tablet Commonly known as: ZOCOR Take 20 mg by mouth daily.        Allergies:  No Known Allergies  Family History: No family history on file.  Social History:   reports that she has never smoked. She has never used smokeless tobacco. She reports current alcohol use. She reports that she does not use drugs.  Physical Exam: BP (!) 169/74   Pulse (!) 58   Ht '5\' 2"'$  (1.575 m)   Wt 148 lb (67.1 kg)   BMI 27.07 kg/m   Constitutional:  Alert and oriented, no acute  distress, nontoxic appearing HEENT: [place], AT Cardiovascular: No clubbing, cyanosis, or edema Respiratory: Normal respiratory effort, no increased work of breathing Skin: No rashes, bruises or suspicious lesions Neurologic: Grossly intact, no focal deficits, moving all 4 extremities Psychiatric: Normal mood and affect  Laboratory Data: Results for orders placed or performed in visit on 03/21/22  Microscopic Examination   Urine  Result Value Ref Range   WBC, UA >30 (A) 0 - 5 /hpf   RBC, Urine 3-10 (A) 0 - 2 /hpf   Epithelial Cells (non renal) 0-10 0 - 10 /hpf   Bacteria, UA Many (A) None  seen/Few  Urinalysis, Complete  Result Value Ref Range   Specific Gravity, UA 1.015 1.005 - 1.030   pH, UA 5.5 5.0 - 7.5   Color, UA Yellow Yellow   Appearance Ur Cloudy (A) Clear   Leukocytes,UA 2+ (A) Negative   Protein,UA 1+ (A) Negative/Trace   Glucose, UA Negative Negative   Ketones, UA Negative Negative   RBC, UA Trace (A) Negative   Bilirubin, UA Negative Negative   Urobilinogen, Ur 0.2 0.2 - 1.0 mg/dL   Nitrite, UA Positive (A) Negative   Microscopic Examination See below:    Assessment & Plan:   1. Recurrent UTI Continued dysuria despite multiple recent negative urine cultures in the setting of grossly positive UAs.  Agree with upcoming cystoscopy.  Cath UA today is rather stable compared to prior.  We will send for standard and atypical culture today and contact her with results.  In the meantime, I suspect her symptoms may represent more of an inflammatory chronic cystitis and I counseled her to start daily Macrobid now for its anti-inflammatory effect.  We will interrupt suppressive therapy for treatment course of antibiotics as indicated per cultures.  In the meantime, counseled her to continue estrogen cream and cranberry, d-mannose, and probiotic supplements.  She is in agreement with this plan. - Urinalysis, Complete - CULTURE, URINE COMPREHENSIVE - Mycoplasma / ureaplasma culture   Return for Will call with results.  Debroah Loop, PA-C  [facility] [address], [place], [place] [address] [phone]

## 2022-03-21 NOTE — Telephone Encounter (Signed)
Pt still having symptoms , wanted to be seen. Sw granddaughter and pt is schedule to come to clinic today. Left message for pt.

## 2022-03-22 LAB — URINALYSIS, COMPLETE
Bilirubin, UA: NEGATIVE
Glucose, UA: NEGATIVE
Ketones, UA: NEGATIVE
Nitrite, UA: POSITIVE — AB
Specific Gravity, UA: 1.015 (ref 1.005–1.030)
Urobilinogen, Ur: 0.2 mg/dL (ref 0.2–1.0)
pH, UA: 5.5 (ref 5.0–7.5)

## 2022-03-22 LAB — MICROSCOPIC EXAMINATION: WBC, UA: >30 /hpf — AB (ref 0–5)

## 2022-03-24 ENCOUNTER — Other Ambulatory Visit: Payer: Self-pay | Admitting: *Deleted

## 2022-03-24 LAB — CULTURE, URINE COMPREHENSIVE

## 2022-03-24 MED ORDER — CEFUROXIME AXETIL 250 MG PO TABS: 250.0000 mg | ORAL_TABLET | Freq: Two times a day (BID) | ORAL | 0 refills | Status: AC

## 2022-03-28 LAB — MYCOPLASMA / UREAPLASMA CULTURE
Mycoplasma hominis Culture: NEGATIVE
Ureaplasma urealyticum: NEGATIVE

## 2022-04-06 ENCOUNTER — Other Ambulatory Visit: Payer: Medicare HMO | Admitting: Urology

## 2022-04-13 ENCOUNTER — Ambulatory Visit: Payer: Medicare HMO | Admitting: Urology

## 2022-04-13 ENCOUNTER — Encounter: Payer: Self-pay | Admitting: Urology

## 2022-04-13 VITALS — BP 164/81 | HR 57 | Wt 148.0 lb

## 2022-04-13 DIAGNOSIS — R310 Gross hematuria: Secondary | ICD-10-CM | POA: Diagnosis not present

## 2022-04-13 DIAGNOSIS — Z87440 Personal history of urinary (tract) infections: Secondary | ICD-10-CM

## 2022-04-13 LAB — MICROSCOPIC EXAMINATION

## 2022-04-13 LAB — URINALYSIS, COMPLETE
Bilirubin, UA: NEGATIVE
Glucose, UA: NEGATIVE
Ketones, UA: NEGATIVE
Nitrite, UA: NEGATIVE
Protein,UA: NEGATIVE
RBC, UA: NEGATIVE
Specific Gravity, UA: 1.02 (ref 1.005–1.030)
Urobilinogen, Ur: 0.2 mg/dL (ref 0.2–1.0)
pH, UA: 5 (ref 5.0–7.5)

## 2022-04-13 NOTE — Progress Notes (Signed)
   04/13/22  CC:  Chief Complaint  Patient presents with   Cysto    HPI: 88 year old female with personal history of recurrent UTIs and irritative urinary symptoms who presents today for cystoscopic evaluation.  She is currently on suppressive antibiotics.  Please see previous notes for details.  NED. A&Ox3.   No respiratory distress   Abd soft, NT, ND Normal external genitalia with patent urethral meatus  Cystoscopy Procedure Note  Patient identification was confirmed, informed consent was obtained, and patient was prepped using Betadine solution.  Lidocaine jelly was administered per urethral meatus.    Procedure: - Flexible cystoscope introduced, without any difficulty.   - Thorough search of the bladder revealed:    normal urethral meatus    normal urothelium    no stones    no ulcers     no tumors    no urethral polyps    no trabeculation  - Ureteral orifices were normal in position and appearance.  Post-Procedure: - Patient tolerated the procedure well  Assessment/ Plan:  1. Gross hematuria/recurrent UTIs Cystoscopy today is very reassuring, bladder is normal without any obvious pathology  She has had a renal ultrasound which was negative for upper tract imaging, hematuria likely in the setting of recurrent UTIs.  Complete current 49-monthcourse of suppressive antibiotics and continue topical estrogen cream along with previous the discussed supplements. - Urinalysis, Complete    AHollice Espy MD

## 2022-04-26 DIAGNOSIS — Z85820 Personal history of malignant melanoma of skin: Secondary | ICD-10-CM | POA: Diagnosis not present

## 2022-04-26 DIAGNOSIS — L570 Actinic keratosis: Secondary | ICD-10-CM | POA: Diagnosis not present

## 2022-04-26 DIAGNOSIS — R58 Hemorrhage, not elsewhere classified: Secondary | ICD-10-CM | POA: Diagnosis not present

## 2022-04-26 DIAGNOSIS — Z86006 Personal history of melanoma in-situ: Secondary | ICD-10-CM | POA: Diagnosis not present

## 2022-04-26 DIAGNOSIS — Z85828 Personal history of other malignant neoplasm of skin: Secondary | ICD-10-CM | POA: Diagnosis not present

## 2022-04-26 DIAGNOSIS — L820 Inflamed seborrheic keratosis: Secondary | ICD-10-CM | POA: Diagnosis not present

## 2022-04-26 DIAGNOSIS — D2271 Melanocytic nevi of right lower limb, including hip: Secondary | ICD-10-CM | POA: Diagnosis not present

## 2022-04-26 DIAGNOSIS — D225 Melanocytic nevi of trunk: Secondary | ICD-10-CM | POA: Diagnosis not present

## 2022-05-03 ENCOUNTER — Other Ambulatory Visit: Payer: Medicare HMO | Admitting: Urology

## 2022-07-11 DIAGNOSIS — Z79899 Other long term (current) drug therapy: Secondary | ICD-10-CM | POA: Diagnosis not present

## 2022-07-11 DIAGNOSIS — I129 Hypertensive chronic kidney disease with stage 1 through stage 4 chronic kidney disease, or unspecified chronic kidney disease: Secondary | ICD-10-CM | POA: Diagnosis not present

## 2022-07-11 DIAGNOSIS — Z87891 Personal history of nicotine dependence: Secondary | ICD-10-CM | POA: Diagnosis not present

## 2022-07-11 DIAGNOSIS — N182 Chronic kidney disease, stage 2 (mild): Secondary | ICD-10-CM | POA: Diagnosis not present

## 2022-07-11 DIAGNOSIS — Z0000 Encounter for general adult medical examination without abnormal findings: Secondary | ICD-10-CM | POA: Diagnosis not present

## 2022-07-11 DIAGNOSIS — E7800 Pure hypercholesterolemia, unspecified: Secondary | ICD-10-CM | POA: Diagnosis not present

## 2022-08-10 DIAGNOSIS — F411 Generalized anxiety disorder: Secondary | ICD-10-CM | POA: Diagnosis not present

## 2022-08-18 ENCOUNTER — Other Ambulatory Visit: Payer: Self-pay | Admitting: Urology

## 2022-08-18 DIAGNOSIS — N390 Urinary tract infection, site not specified: Secondary | ICD-10-CM

## 2022-08-19 NOTE — Telephone Encounter (Signed)
Pt called office to let us know she only has 1 pill left.  Her appt w/Brandon isn't until 2/5.  She uses [facility].

## 2022-08-24 DIAGNOSIS — F411 Generalized anxiety disorder: Secondary | ICD-10-CM | POA: Diagnosis not present

## 2022-09-15 ENCOUNTER — Ambulatory Visit: Payer: Medicare HMO | Admitting: Physician Assistant

## 2022-09-19 ENCOUNTER — Ambulatory Visit: Payer: Medicare HMO | Admitting: Physician Assistant

## 2022-09-19 VITALS — BP 154/82 | HR 51 | Ht 62.0 in | Wt 147.0 lb

## 2022-09-19 DIAGNOSIS — Z87440 Personal history of urinary (tract) infections: Secondary | ICD-10-CM

## 2022-09-19 DIAGNOSIS — N390 Urinary tract infection, site not specified: Secondary | ICD-10-CM | POA: Diagnosis not present

## 2022-09-19 DIAGNOSIS — R310 Gross hematuria: Secondary | ICD-10-CM | POA: Diagnosis not present

## 2022-09-19 LAB — URINALYSIS, COMPLETE
Bilirubin, UA: NEGATIVE
Glucose, UA: NEGATIVE
Nitrite, UA: NEGATIVE
RBC, UA: NEGATIVE
Specific Gravity, UA: 1.02 (ref 1.005–1.030)
Urobilinogen, Ur: 1 mg/dL (ref 0.2–1.0)
pH, UA: 5.5 (ref 5.0–7.5)

## 2022-09-19 LAB — MICROSCOPIC EXAMINATION

## 2022-09-19 NOTE — Progress Notes (Signed)
09/19/2022 12:56 PM   SKYLEY GRANDMAISON 14-Sep-1925 [id number]  CC: Chief Complaint  Patient presents with   Recurrent UTI   HPI: Nancy Warren is a 89 y.o. female with PMH current UTI and irritative voiding symptoms on suppressive Macrobid x 6 months and estrogen cream who presents today for 6 month follow-up.   Today she reports she has about 1 week left on her prescription of daily suppressive Macrobid.  She has had no breakthrough infections while on this therapy.  She continues to use estrogen cream 3 times weekly.  She reports her quality of life is greatly improved on suppressive therapy and wonders if she should continue it.  In-office UA today positive for trace ketones, 1+ protein, and trace leukocytes; urine microscopy with 6-10 WBCs/HPF and moderate bacteria.  PMH: Past Medical History:  Diagnosis Date   Hypertension    Melanoma of skin ([place])    Pt states was resected from her back 40 years ago.     Surgical History: No past surgical history on file.  Home Medications:  Allergies as of 09/19/2022   No Known Allergies      Medication List        Accurate as of September 19, 2022 12:56 PM. If you have any questions, ask your nurse or doctor.          ASPIRIN 81 PO Aspir-81   cyanocobalamin 1000 MCG tablet Take by mouth.   estradiol 0.1 MG/GM vaginal cream Commonly known as: ESTRACE Apply pea size amount to urethral area mon, wed, Friday.   lisinopril 10 MG tablet Commonly known as: ZESTRIL Take 10 mg by mouth 2 (two) times daily.   metoprolol succinate 100 MG 24 hr tablet Commonly known as: TOPROL-XL Take by mouth.   nitrofurantoin (macrocrystal-monohydrate) 100 MG capsule Commonly known as: Macrobid Take 1 capsule (100 mg total) by mouth daily.   omeprazole 40 MG capsule Commonly known as: PRILOSEC Take 40 mg by mouth daily.   simvastatin 20 MG tablet Commonly known as: ZOCOR Take 20 mg by mouth daily.        Allergies:  No  Known Allergies  Family History: No family history on file.  Social History:   reports that she has never smoked. She has never used smokeless tobacco. She reports current alcohol use. She reports that she does not use drugs.  Physical Exam: BP (!) 154/82   Pulse (!) 51   Ht '5\' 2"'$  (1.575 m)   Wt 147 lb (66.7 kg)   BMI 26.89 kg/m   Constitutional:  Alert and oriented, no acute distress, nontoxic appearing HEENT: [place], AT Cardiovascular: No clubbing, cyanosis, or edema Respiratory: Normal respiratory effort, no increased work of breathing Skin: No rashes, bruises or suspicious lesions Neurologic: Grossly intact, no focal deficits, moving all 4 extremities Psychiatric: Normal mood and affect  Laboratory Data: Results for orders placed or performed in visit on 09/19/22  Microscopic Examination   Urine  Result Value Ref Range   WBC, UA 6-10 (A) 0 - 5 /hpf   RBC, Urine 0-2 0 - 2 /hpf   Epithelial Cells (non renal) 0-10 0 - 10 /hpf   Casts Present (A) None seen /lpf   Cast Type Hyaline casts N/A   Mucus, UA Present (A) Not Estab.   Bacteria, UA Moderate (A) None seen/Few  Urinalysis, Complete  Result Value Ref Range   Specific Gravity, UA 1.020 1.005 - 1.030   pH, UA 5.5 5.0 - 7.5  Color, UA Yellow Yellow   Appearance Ur Hazy (A) Clear   Leukocytes,UA Trace (A) Negative   Protein,UA 1+ (A) Negative/Trace   Glucose, UA Negative Negative   Ketones, UA Trace (A) Negative   RBC, UA Negative Negative   Bilirubin, UA Negative Negative   Urobilinogen, Ur 1.0 0.2 - 1.0 mg/dL   Nitrite, UA Negative Negative   Microscopic Examination See below:    Assessment & Plan:   1. Recurrent UTI Asymptomatic and tolerating daily suppressive Macrobid without difficulty.  She continues to use estrogen cream as prescribed.  We discussed the risks of long-term Macrobid including increased risk for drug resistance, as well as pulmonary and hepatic toxicity.  We discussed various options  including stopping Macrobid upon completion of 6 months of therapy with consideration of resuming it in the future if her symptoms return, continuing daily suppression at the 100 mg dose, and continuing daily suppression at the 50 mg dose.  Together in shared decision-making, we elected to stop Macrobid upon completion of her prescribed 6 months of therapy, which is anticipated to be sometime in the coming 2 weeks.  If she continues to have recurrent UTIs following completion of therapy, will consider resuming suppressive Macrobid. - Urinalysis, Complete  Return if symptoms worsen or fail to improve.  Debroah Loop, PA-C  [facility] [address] [place], [place] [address] [phone]

## 2022-09-21 DIAGNOSIS — F411 Generalized anxiety disorder: Secondary | ICD-10-CM | POA: Diagnosis not present

## 2022-10-19 DIAGNOSIS — F411 Generalized anxiety disorder: Secondary | ICD-10-CM | POA: Diagnosis not present

## 2022-12-03 DIAGNOSIS — F411 Generalized anxiety disorder: Secondary | ICD-10-CM | POA: Diagnosis not present

## 2022-12-17 DIAGNOSIS — F411 Generalized anxiety disorder: Secondary | ICD-10-CM | POA: Diagnosis not present

## 2022-12-27 DIAGNOSIS — D2272 Melanocytic nevi of left lower limb, including hip: Secondary | ICD-10-CM | POA: Diagnosis not present

## 2022-12-27 DIAGNOSIS — D2262 Melanocytic nevi of left upper limb, including shoulder: Secondary | ICD-10-CM | POA: Diagnosis not present

## 2022-12-27 DIAGNOSIS — D485 Neoplasm of uncertain behavior of skin: Secondary | ICD-10-CM | POA: Diagnosis not present

## 2022-12-27 DIAGNOSIS — L570 Actinic keratosis: Secondary | ICD-10-CM | POA: Diagnosis not present

## 2022-12-27 DIAGNOSIS — Z85820 Personal history of malignant melanoma of skin: Secondary | ICD-10-CM | POA: Diagnosis not present

## 2022-12-27 DIAGNOSIS — Z86006 Personal history of melanoma in-situ: Secondary | ICD-10-CM | POA: Diagnosis not present

## 2022-12-27 DIAGNOSIS — Z85828 Personal history of other malignant neoplasm of skin: Secondary | ICD-10-CM | POA: Diagnosis not present

## 2022-12-27 DIAGNOSIS — D2261 Melanocytic nevi of right upper limb, including shoulder: Secondary | ICD-10-CM | POA: Diagnosis not present

## 2022-12-27 DIAGNOSIS — D225 Melanocytic nevi of trunk: Secondary | ICD-10-CM | POA: Diagnosis not present

## 2022-12-27 DIAGNOSIS — D044 Carcinoma in situ of skin of scalp and neck: Secondary | ICD-10-CM | POA: Diagnosis not present

## 2023-01-03 DIAGNOSIS — D044 Carcinoma in situ of skin of scalp and neck: Secondary | ICD-10-CM | POA: Diagnosis not present

## 2023-01-10 DIAGNOSIS — K219 Gastro-esophageal reflux disease without esophagitis: Secondary | ICD-10-CM | POA: Diagnosis not present

## 2023-01-10 DIAGNOSIS — N189 Chronic kidney disease, unspecified: Secondary | ICD-10-CM | POA: Diagnosis not present

## 2023-01-10 DIAGNOSIS — E785 Hyperlipidemia, unspecified: Secondary | ICD-10-CM | POA: Diagnosis not present

## 2023-01-10 DIAGNOSIS — I129 Hypertensive chronic kidney disease with stage 1 through stage 4 chronic kidney disease, or unspecified chronic kidney disease: Secondary | ICD-10-CM | POA: Diagnosis not present

## 2023-01-10 DIAGNOSIS — Z79899 Other long term (current) drug therapy: Secondary | ICD-10-CM | POA: Diagnosis not present

## 2023-01-25 DIAGNOSIS — N182 Chronic kidney disease, stage 2 (mild): Secondary | ICD-10-CM | POA: Diagnosis not present

## 2023-03-16 ENCOUNTER — Telehealth: Payer: Self-pay | Admitting: Urology

## 2023-03-16 DIAGNOSIS — N390 Urinary tract infection, site not specified: Secondary | ICD-10-CM

## 2023-03-16 DIAGNOSIS — R310 Gross hematuria: Secondary | ICD-10-CM

## 2023-03-16 MED ORDER — ESTRADIOL 0.1 MG/GM VA CREA: TOPICAL_CREAM | VAGINAL | 12 refills | Status: DC

## 2023-03-16 NOTE — Telephone Encounter (Signed)
Rx refilled. Please schedule her for annual f/u sx recheck with me.

## 2023-03-16 NOTE — Telephone Encounter (Signed)
Patient is requesting refill for Estradiol vaginal cream. Pharmacy is [facility] in [place].

## 2023-03-16 NOTE — Telephone Encounter (Signed)
Sam, you saw patient last on 09/19/22, when should she follow up?

## 2023-03-16 NOTE — Telephone Encounter (Signed)
Pt informed, voiced understanding

## 2023-07-18 DIAGNOSIS — D485 Neoplasm of uncertain behavior of skin: Secondary | ICD-10-CM | POA: Diagnosis not present

## 2023-07-18 DIAGNOSIS — D2261 Melanocytic nevi of right upper limb, including shoulder: Secondary | ICD-10-CM | POA: Diagnosis not present

## 2023-07-18 DIAGNOSIS — D225 Melanocytic nevi of trunk: Secondary | ICD-10-CM | POA: Diagnosis not present

## 2023-07-18 DIAGNOSIS — C44722 Squamous cell carcinoma of skin of right lower limb, including hip: Secondary | ICD-10-CM | POA: Diagnosis not present

## 2023-07-18 DIAGNOSIS — C44329 Squamous cell carcinoma of skin of other parts of face: Secondary | ICD-10-CM | POA: Diagnosis not present

## 2023-07-18 DIAGNOSIS — Z85820 Personal history of malignant melanoma of skin: Secondary | ICD-10-CM | POA: Diagnosis not present

## 2023-07-18 DIAGNOSIS — Z85828 Personal history of other malignant neoplasm of skin: Secondary | ICD-10-CM | POA: Diagnosis not present

## 2023-07-18 DIAGNOSIS — D2262 Melanocytic nevi of left upper limb, including shoulder: Secondary | ICD-10-CM | POA: Diagnosis not present

## 2023-07-18 DIAGNOSIS — D2272 Melanocytic nevi of left lower limb, including hip: Secondary | ICD-10-CM | POA: Diagnosis not present

## 2023-07-18 DIAGNOSIS — Z86006 Personal history of melanoma in-situ: Secondary | ICD-10-CM | POA: Diagnosis not present

## 2023-07-18 DIAGNOSIS — L570 Actinic keratosis: Secondary | ICD-10-CM | POA: Diagnosis not present

## 2023-08-29 DIAGNOSIS — C44722 Squamous cell carcinoma of skin of right lower limb, including hip: Secondary | ICD-10-CM | POA: Diagnosis not present

## 2023-08-29 DIAGNOSIS — D485 Neoplasm of uncertain behavior of skin: Secondary | ICD-10-CM | POA: Diagnosis not present

## 2023-08-29 DIAGNOSIS — C44329 Squamous cell carcinoma of skin of other parts of face: Secondary | ICD-10-CM | POA: Diagnosis not present

## 2023-09-12 DIAGNOSIS — D0439 Carcinoma in situ of skin of other parts of face: Secondary | ICD-10-CM | POA: Diagnosis not present

## 2023-09-12 DIAGNOSIS — C44329 Squamous cell carcinoma of skin of other parts of face: Secondary | ICD-10-CM | POA: Diagnosis not present

## 2023-09-20 ENCOUNTER — Ambulatory Visit: Payer: Medicare HMO | Admitting: Physician Assistant

## 2023-09-20 DIAGNOSIS — Z09 Encounter for follow-up examination after completed treatment for conditions other than malignant neoplasm: Secondary | ICD-10-CM | POA: Diagnosis not present

## 2023-09-20 DIAGNOSIS — Z87440 Personal history of urinary (tract) infections: Secondary | ICD-10-CM | POA: Diagnosis not present

## 2023-09-20 DIAGNOSIS — N390 Urinary tract infection, site not specified: Secondary | ICD-10-CM

## 2023-09-20 MED ORDER — ESTRADIOL 0.1 MG/GM VA CREA: TOPICAL_CREAM | VAGINAL | 12 refills | Status: AC

## 2023-09-20 MED ORDER — NITROFURANTOIN MONOHYD MACRO 100 MG PO CAPS: 100.0000 mg | ORAL_CAPSULE | Freq: Every day | ORAL | 3 refills | Status: AC

## 2023-09-20 NOTE — Progress Notes (Signed)
   09/20/2023 10:24 AM   Nancy Warren 11/24/1925 [id number]  CC: Chief Complaint  Patient presents with   Other   HPI: TATIONA STECH is a 89 y.o. female with PMH recurrent UTI and irritative voiding symptoms on estrogen cream and suppressive Macrobid  who presents today for annual follow-up.   Today she reports no UTIs since I last saw her.  She has been doing well with no significant changes in her health.  She has had a couple of skin cancer excisions with dermatology.  No new voiding symptoms to report.  PMH: Past Medical History:  Diagnosis Date   Hypertension    Melanoma of skin ([facility])    Pt states was resected from her back 40 years ago.     Surgical History: No past surgical history on file.  Home Medications:  Allergies as of 09/20/2023   No Known Allergies      Medication List        Accurate as of September 20, 2023 10:24 AM. If you have any questions, ask your nurse or doctor.          ASPIRIN 81 PO Aspir-81   CranRX 250-30-50 MG Chew Generic drug: Cranberry-Vitamin C-D Mannose Chew by mouth.   cyanocobalamin 1000 MCG tablet Take by mouth.   estradiol  0.1 MG/GM vaginal cream Commonly known as: ESTRACE  Apply pea size amount to urethral area mon, wed, Friday.   lisinopril 10 MG tablet Commonly known as: ZESTRIL Take 10 mg by mouth 2 (two) times daily.   metoprolol succinate 100 MG 24 hr tablet Commonly known as: TOPROL-XL Take by mouth.   nitrofurantoin  (macrocrystal-monohydrate) 100 MG capsule Commonly known as: Macrobid  Take 1 capsule (100 mg total) by mouth daily.   omeprazole 40 MG capsule Commonly known as: PRILOSEC Take 40 mg by mouth daily.   simvastatin 20 MG tablet Commonly known as: ZOCOR Take 20 mg by mouth daily.        Allergies:  No Known Allergies  Family History: No family history on file.  Social History:   reports that she has never smoked. She has never used smokeless tobacco. She reports current  alcohol use. She reports that she does not use drugs.  Physical Exam: BP (!) 177/83   Pulse 60   Ht 5' 1 (1.549 m)   Wt 146 lb (66.2 kg)   BMI 27.59 kg/m   Constitutional:  Alert and oriented, no acute distress, nontoxic appearing HEENT: [place], AT Cardiovascular: No clubbing, cyanosis, or edema Respiratory: Normal respiratory effort, no increased work of breathing Skin: No rashes, bruises or suspicious lesions Neurologic: Grossly intact, no focal deficits, moving all 4 extremities Psychiatric: Normal mood and affect  Assessment & Plan:   1. Recurrent UTI Well-controlled on suppressive Macrobid  and estrogen cream, no breakthrough infections since starting this.  Will continue her current regimen as is.  She is in agreement with this plan. - nitrofurantoin , macrocrystal-monohydrate, (MACROBID ) 100 MG capsule; Take 1 capsule (100 mg total) by mouth daily.  Dispense: 90 capsule; Refill: 3 - estradiol  (ESTRACE ) 0.1 MG/GM vaginal cream; Apply pea size amount to urethral area mon, wed, Friday.  Dispense: 42.5 g; Refill: 12   Return in about 1 year (around 09/19/2024) for Annual recurrent UTI follow-up.  Lucie Hones, PA-C  [facility] [place] [address] [place], [place] [address] [phone]

## 2023-10-17 DIAGNOSIS — C44319 Basal cell carcinoma of skin of other parts of face: Secondary | ICD-10-CM | POA: Diagnosis not present

## 2023-12-11 DIAGNOSIS — R69 Illness, unspecified: Secondary | ICD-10-CM | POA: Diagnosis not present

## 2024-01-25 DIAGNOSIS — C44319 Basal cell carcinoma of skin of other parts of face: Secondary | ICD-10-CM | POA: Diagnosis not present

## 2024-01-25 DIAGNOSIS — D2272 Melanocytic nevi of left lower limb, including hip: Secondary | ICD-10-CM | POA: Diagnosis not present

## 2024-01-25 DIAGNOSIS — D2262 Melanocytic nevi of left upper limb, including shoulder: Secondary | ICD-10-CM | POA: Diagnosis not present

## 2024-01-25 DIAGNOSIS — Z85820 Personal history of malignant melanoma of skin: Secondary | ICD-10-CM | POA: Diagnosis not present

## 2024-01-25 DIAGNOSIS — L570 Actinic keratosis: Secondary | ICD-10-CM | POA: Diagnosis not present

## 2024-01-25 DIAGNOSIS — Z85828 Personal history of other malignant neoplasm of skin: Secondary | ICD-10-CM | POA: Diagnosis not present

## 2024-01-25 DIAGNOSIS — D2261 Melanocytic nevi of right upper limb, including shoulder: Secondary | ICD-10-CM | POA: Diagnosis not present

## 2024-01-25 DIAGNOSIS — D485 Neoplasm of uncertain behavior of skin: Secondary | ICD-10-CM | POA: Diagnosis not present

## 2024-01-25 DIAGNOSIS — C44712 Basal cell carcinoma of skin of right lower limb, including hip: Secondary | ICD-10-CM | POA: Diagnosis not present

## 2024-01-25 DIAGNOSIS — D225 Melanocytic nevi of trunk: Secondary | ICD-10-CM | POA: Diagnosis not present

## 2024-01-25 DIAGNOSIS — D2271 Melanocytic nevi of right lower limb, including hip: Secondary | ICD-10-CM | POA: Diagnosis not present

## 2024-03-06 DIAGNOSIS — I10 Essential (primary) hypertension: Secondary | ICD-10-CM | POA: Diagnosis not present

## 2024-03-13 DIAGNOSIS — K219 Gastro-esophageal reflux disease without esophagitis: Secondary | ICD-10-CM | POA: Diagnosis not present

## 2024-03-13 DIAGNOSIS — Z0000 Encounter for general adult medical examination without abnormal findings: Secondary | ICD-10-CM | POA: Diagnosis not present

## 2024-03-13 DIAGNOSIS — Z1331 Encounter for screening for depression: Secondary | ICD-10-CM | POA: Diagnosis not present

## 2024-03-13 DIAGNOSIS — I10 Essential (primary) hypertension: Secondary | ICD-10-CM | POA: Diagnosis not present

## 2024-03-13 DIAGNOSIS — R079 Chest pain, unspecified: Secondary | ICD-10-CM | POA: Diagnosis not present

## 2024-03-13 DIAGNOSIS — E785 Hyperlipidemia, unspecified: Secondary | ICD-10-CM | POA: Diagnosis not present

## 2024-04-01 DIAGNOSIS — I10 Essential (primary) hypertension: Secondary | ICD-10-CM | POA: Diagnosis not present

## 2024-04-01 DIAGNOSIS — S8991XA Unspecified injury of right lower leg, initial encounter: Secondary | ICD-10-CM | POA: Diagnosis not present

## 2024-04-04 DIAGNOSIS — R079 Chest pain, unspecified: Secondary | ICD-10-CM | POA: Diagnosis not present

## 2024-04-23 DIAGNOSIS — C44319 Basal cell carcinoma of skin of other parts of face: Secondary | ICD-10-CM | POA: Diagnosis not present

## 2024-04-30 DIAGNOSIS — C44712 Basal cell carcinoma of skin of right lower limb, including hip: Secondary | ICD-10-CM | POA: Diagnosis not present

## 2024-09-19 ENCOUNTER — Ambulatory Visit: Payer: Medicare HMO

## 2024-09-19 ENCOUNTER — Encounter: Payer: Self-pay | Admitting: Physician Assistant

## 2024-09-19 VITALS — BP 185/93 | HR 56 | Ht 61.0 in | Wt 141.0 lb

## 2024-09-19 DIAGNOSIS — N390 Urinary tract infection, site not specified: Secondary | ICD-10-CM

## 2024-09-19 MED ORDER — ESTRADIOL 0.01 % VA CREA: TOPICAL_CREAM | VAGINAL | 12 refills | Status: AC

## 2024-09-19 NOTE — Progress Notes (Signed)
 "  09/19/2024 11:46 AM   Nancy Warren [birth year] [id number]  CC: Chief Complaint  Patient presents with   Recurrent UTI    HPI: Nancy Warren is a 89 y.o. female with PMH significant for recurrent UTI and irritative voiding symptoms on estrogen cream and suppressive Macrobid  who presents today for annual follow-up.   Today she reports no urinary symptoms. Overall, she is doing very well. She reports a history of acute rip hip pain and has an upcoming appointment with ortho surgery scheduled for later today. She is being followed by dermatology for diagnosis of basal cell carcinoma. No new voiding symptoms.  Denies dysuria, hematuria, abdominal pain, urinary frequency or urgency, fever, chills, or nausea.   PMH: Past Medical History:  Diagnosis Date   Hypertension    Melanoma of skin (HCC)    Pt states was resected from her back 40 years ago.     Surgical History: No past surgical history on file.  Home Medications:  Allergies as of 09/19/2024   No Known Allergies      Medication List        Accurate as of September 19, 2024 11:46 AM. If you have any questions, ask your nurse or doctor.          ASPIRIN 81 PO Aspir-81   CranRX 250-30-50 MG Chew Generic drug: Cranberry-Vitamin C-D Mannose Chew by mouth.   cyanocobalamin 1000 MCG tablet Take by mouth.   estradiol  0.1 MG/GM vaginal cream Commonly known as: ESTRACE  Apply pea size amount to urethral area mon, wed, Friday. What changed: Another medication with the same name was added. Make sure you understand how and when to take each. Changed by: Marry KIDD Era Parr   estradiol  0.01 % Crea vaginal cream Commonly known as: ESTRACE  Apply one pea-sized amount around the opening of the urethra daily for 2 weeks, then 3 times weekly moving forward. What changed: You were already taking a medication with the same name, and this prescription was added. Make sure you understand how and when to take each. Changed  by: Marry KIDD Sarae Nicholes   lisinopril 10 MG tablet Commonly known as: ZESTRIL Take 10 mg by mouth 2 (two) times daily.   metoprolol succinate 100 MG 24 hr tablet Commonly known as: TOPROL-XL Take by mouth.   nitrofurantoin  (macrocrystal-monohydrate) 100 MG capsule Commonly known as: Macrobid  Take 1 capsule (100 mg total) by mouth daily.   omeprazole 40 MG capsule Commonly known as: PRILOSEC Take 40 mg by mouth daily.   simvastatin 20 MG tablet Commonly known as: ZOCOR Take 20 mg by mouth daily.        Allergies:  Allergies[1]  Family History: No family history on file.  Social History:   reports that she has never smoked. She has never used smokeless tobacco. She reports current alcohol use. She reports that she does not use drugs.  Physical Exam: BP (!) 185/93   Pulse (!) 56   Ht 5' 1 (1.549 m)   Wt 141 lb (64 kg)   BMI 26.64 kg/m   Constitutional:  Alert and oriented, no acute distress, nontoxic appearing HEENT: , AT Cardiovascular: No clubbing, cyanosis, or edema Respiratory: Normal respiratory effort, no increased work of breathing Skin: No rashes, bruises or suspicious lesions Neurologic: Grossly intact, no focal deficits, moving all 4 extremities Psychiatric: Normal mood and affect  Laboratory Data: No results found for: WBC, HGB, HCT, MCV, PLT  No results found for: CREATININE  CrCl cannot be calculated (No  successful lab value found.).  Results for orders placed or performed in visit on 09/19/22  Microscopic Examination   Collection Time: 09/19/22 10:44 AM   Urine  Result Value Ref Range   WBC, UA 6-10 (A) 0 - 5 /hpf   RBC, Urine 0-2 0 - 2 /hpf   Epithelial Cells (non renal) 0-10 0 - 10 /hpf   Casts Present (A) None seen /lpf   Cast Type Hyaline casts N/A   Mucus, UA Present (A) Not Estab.   Bacteria, UA Moderate (A) None seen/Few  Urinalysis, Complete   Collection Time: 09/19/22 10:44 AM  Result Value Ref Range   Specific  Gravity, UA 1.020 1.005 - 1.030   pH, UA 5.5 5.0 - 7.5   Color, UA Yellow Yellow   Appearance Ur Hazy (A) Clear   Leukocytes,UA Trace (A) Negative   Protein,UA 1+ (A) Negative/Trace   Glucose, UA Negative Negative   Ketones, UA Trace (A) Negative   RBC, UA Negative Negative   Bilirubin, UA Negative Negative   Urobilinogen, Ur 1.0 0.2 - 1.0 mg/dL   Nitrite, UA Negative Negative   Microscopic Examination See below:     Pertinent Imaging: N/A  Assessment & Plan:   Nancy Warren is a 89 y.o. female with PMH significant for recurrent UTI and irritative voiding symptoms on estrogen cream and suppressive Macrobid  who presents today for annual follow-up. Overall the patient is doing very well. No breakthrough infections reported. Will continue with current medication management for recurrent UTIs. Plan to follow-up in 1 year.  Recurrent UTI -  Continue nitrofurantoin , macrocrystal-monohydrate, (MACROBID ) 100 MG; Take 1 capsule (100 mg total) by mouth daily. She does not need refill at this time.  - Continue estradiol  (ESTRACE ) 0.1 MG/GM vaginal cream; Apply pea size amount to urethral area, mon, wed, fri. Refill sent.   RTC in 1 year (09/19/25) for annual recurrent UTI follow-up.  Marry MALVA Sara, PA-C  [facility] [place] [address], [address] [place], [place] [address] [phone]     [1] No Known Allergies  "

## 2024-09-20 ENCOUNTER — Other Ambulatory Visit: Payer: Self-pay | Admitting: Student

## 2024-09-20 ENCOUNTER — Ambulatory Visit: Admission: RE | Admit: 2024-09-20 | Source: Ambulatory Visit

## 2024-09-20 DIAGNOSIS — M1611 Unilateral primary osteoarthritis, right hip: Secondary | ICD-10-CM

## 2024-09-20 DIAGNOSIS — S72001A Fracture of unspecified part of neck of right femur, initial encounter for closed fracture: Secondary | ICD-10-CM

## 2024-09-20 DIAGNOSIS — Z9181 History of falling: Secondary | ICD-10-CM

## 2025-09-19 ENCOUNTER — Ambulatory Visit
